# Patient Record
Sex: Female | Born: 1950 | Race: Black or African American | Hispanic: No | Marital: Married
Health system: Southern US, Community
[De-identification: ages and names within clinical notes are randomized; demographics above are authoritative.]

---

## 2006-11-11 ENCOUNTER — Ambulatory Visit: Payer: Self-pay | Admitting: Family Medicine

## 2007-12-14 ENCOUNTER — Ambulatory Visit: Payer: Self-pay | Admitting: Family Medicine

## 2008-01-19 ENCOUNTER — Other Ambulatory Visit: Payer: Self-pay

## 2008-01-20 ENCOUNTER — Inpatient Hospital Stay: Payer: Self-pay | Admitting: Internal Medicine

## 2008-01-20 ENCOUNTER — Ambulatory Visit: Payer: Self-pay | Admitting: Internal Medicine

## 2008-01-27 ENCOUNTER — Ambulatory Visit: Payer: Self-pay | Admitting: Internal Medicine

## 2008-02-06 ENCOUNTER — Ambulatory Visit: Payer: Self-pay | Admitting: Family

## 2008-02-07 ENCOUNTER — Ambulatory Visit: Payer: Self-pay | Admitting: Gastroenterology

## 2008-02-20 ENCOUNTER — Ambulatory Visit: Payer: Self-pay | Admitting: Internal Medicine

## 2008-02-21 ENCOUNTER — Ambulatory Visit: Payer: Self-pay | Admitting: Gastroenterology

## 2008-03-07 ENCOUNTER — Ambulatory Visit: Payer: Self-pay | Admitting: Gastroenterology

## 2008-03-08 ENCOUNTER — Ambulatory Visit: Payer: Self-pay | Admitting: Gastroenterology

## 2008-04-02 ENCOUNTER — Ambulatory Visit: Payer: Self-pay | Admitting: Family

## 2008-04-30 ENCOUNTER — Ambulatory Visit: Payer: Self-pay | Admitting: Family

## 2008-06-14 ENCOUNTER — Ambulatory Visit: Payer: Self-pay | Admitting: Family

## 2008-07-17 ENCOUNTER — Ambulatory Visit: Payer: Self-pay | Admitting: Gastroenterology

## 2008-08-29 ENCOUNTER — Ambulatory Visit: Payer: Self-pay

## 2008-09-06 ENCOUNTER — Ambulatory Visit: Payer: Self-pay | Admitting: Family

## 2008-12-05 ENCOUNTER — Ambulatory Visit: Payer: Self-pay | Admitting: Family

## 2009-01-29 ENCOUNTER — Ambulatory Visit: Payer: Self-pay | Admitting: Family Medicine

## 2009-04-25 ENCOUNTER — Ambulatory Visit: Payer: Self-pay | Admitting: Family

## 2009-09-26 ENCOUNTER — Ambulatory Visit: Payer: Self-pay | Admitting: Family

## 2009-10-18 ENCOUNTER — Emergency Department: Payer: Self-pay | Admitting: Emergency Medicine

## 2009-10-18 ENCOUNTER — Ambulatory Visit: Payer: Self-pay | Admitting: Family Medicine

## 2009-10-21 ENCOUNTER — Emergency Department: Payer: Self-pay | Admitting: Emergency Medicine

## 2010-01-17 ENCOUNTER — Ambulatory Visit: Payer: Self-pay | Admitting: Family

## 2010-04-03 ENCOUNTER — Ambulatory Visit: Payer: Self-pay | Admitting: Family

## 2010-04-15 ENCOUNTER — Ambulatory Visit: Payer: Self-pay | Admitting: Family Medicine

## 2010-08-08 ENCOUNTER — Ambulatory Visit: Payer: Self-pay | Admitting: Family

## 2011-01-30 ENCOUNTER — Ambulatory Visit: Payer: Self-pay

## 2011-05-21 ENCOUNTER — Other Ambulatory Visit: Payer: Self-pay | Admitting: Family Medicine

## 2011-10-23 ENCOUNTER — Ambulatory Visit: Payer: Self-pay | Admitting: Internal Medicine

## 2011-11-01 LAB — COMPREHENSIVE METABOLIC PANEL
Albumin: 2.1 g/dL — ABNORMAL LOW (ref 3.4–5.0)
Anion Gap: 13 (ref 7–16)
BUN: 28 mg/dL — ABNORMAL HIGH (ref 7–18)
Calcium, Total: 7.4 mg/dL — ABNORMAL LOW (ref 8.5–10.1)
Chloride: 108 mmol/L — ABNORMAL HIGH (ref 98–107)
Co2: 21 mmol/L (ref 21–32)
Creatinine: 1.85 mg/dL — ABNORMAL HIGH (ref 0.60–1.30)
EGFR (African American): 36 — ABNORMAL LOW
EGFR (Non-African Amer.): 30 — ABNORMAL LOW
Glucose: 160 mg/dL — ABNORMAL HIGH (ref 65–99)
Osmolality: 292 (ref 275–301)
Potassium: 3.8 mmol/L (ref 3.5–5.1)
SGOT(AST): 149 U/L — ABNORMAL HIGH (ref 15–37)
SGPT (ALT): 37 U/L
Sodium: 142 mmol/L (ref 136–145)

## 2011-11-01 LAB — URINALYSIS, COMPLETE
Glucose,UR: 150 mg/dL (ref 0–75)
Ketone: NEGATIVE
Ph: 5 (ref 4.5–8.0)
Protein: NEGATIVE
RBC,UR: <1 /HPF (ref 0–5)
Specific Gravity: 1.013 (ref 1.003–1.030)
Squamous Epithelial: 1
White Blood Cell Cast: 1

## 2011-11-01 LAB — CBC
HCT: 31.1 % — ABNORMAL LOW (ref 35.0–47.0)
HGB: 10.3 g/dL — ABNORMAL LOW (ref 12.0–16.0)
MCH: 32 pg (ref 26.0–34.0)
MCHC: 33.1 g/dL (ref 32.0–36.0)
MCV: 97 fL (ref 80–100)
Platelet: 91 10*3/uL — ABNORMAL LOW (ref 150–440)
RDW: 21.8 % — ABNORMAL HIGH (ref 11.5–14.5)
WBC: 3.5 10*3/uL — ABNORMAL LOW (ref 3.6–11.0)

## 2011-11-01 LAB — PROTIME-INR: INR: 1.7

## 2011-11-02 ENCOUNTER — Inpatient Hospital Stay: Payer: Self-pay | Admitting: Internal Medicine

## 2011-11-03 LAB — COMPREHENSIVE METABOLIC PANEL
Albumin: 1.9 g/dL — ABNORMAL LOW (ref 3.4–5.0)
Alkaline Phosphatase: 236 U/L — ABNORMAL HIGH (ref 50–136)
BUN: 33 mg/dL — ABNORMAL HIGH (ref 7–18)
Bilirubin,Total: 17.7 mg/dL — ABNORMAL HIGH (ref 0.2–1.0)
Calcium, Total: 7.1 mg/dL — ABNORMAL LOW (ref 8.5–10.1)
Chloride: 109 mmol/L — ABNORMAL HIGH (ref 98–107)
Creatinine: 2.71 mg/dL — ABNORMAL HIGH (ref 0.60–1.30)
EGFR (African American): 23 — ABNORMAL LOW
EGFR (Non-African Amer.): 19 — ABNORMAL LOW
Glucose: 123 mg/dL — ABNORMAL HIGH (ref 65–99)
Potassium: 3.5 mmol/L (ref 3.5–5.1)
SGOT(AST): 135 U/L — ABNORMAL HIGH (ref 15–37)
SGPT (ALT): 38 U/L
Total Protein: 5.6 g/dL — ABNORMAL LOW (ref 6.4–8.2)

## 2011-11-03 LAB — CBC WITH DIFFERENTIAL/PLATELET
Bands: 7 %
HCT: 29.4 % — ABNORMAL LOW (ref 35.0–47.0)
HGB: 9.6 g/dL — ABNORMAL LOW (ref 12.0–16.0)
Lymphocytes: 20 %
MCHC: 32.7 g/dL (ref 32.0–36.0)
MCV: 96 fL (ref 80–100)
Platelet: 73 10*3/uL — ABNORMAL LOW (ref 150–440)
RDW: 21.2 % — ABNORMAL HIGH (ref 11.5–14.5)
Segmented Neutrophils: 53 %

## 2011-11-03 LAB — URINE CULTURE

## 2011-11-20 ENCOUNTER — Ambulatory Visit: Payer: Self-pay | Admitting: Internal Medicine

## 2011-12-21 DEATH — deceased

## 2012-08-26 IMAGING — CR DG CHEST 2V
1 series · 2 of 2 positions shown · non-contrast
Comparison: none

REASON FOR EXAM: fever, altered mental status, lever failure, recent
pneumonia
COMMENTS:

[Series 1: ap · 0.17mm/px · 2 of 2 slices shown]
[im 1/2]
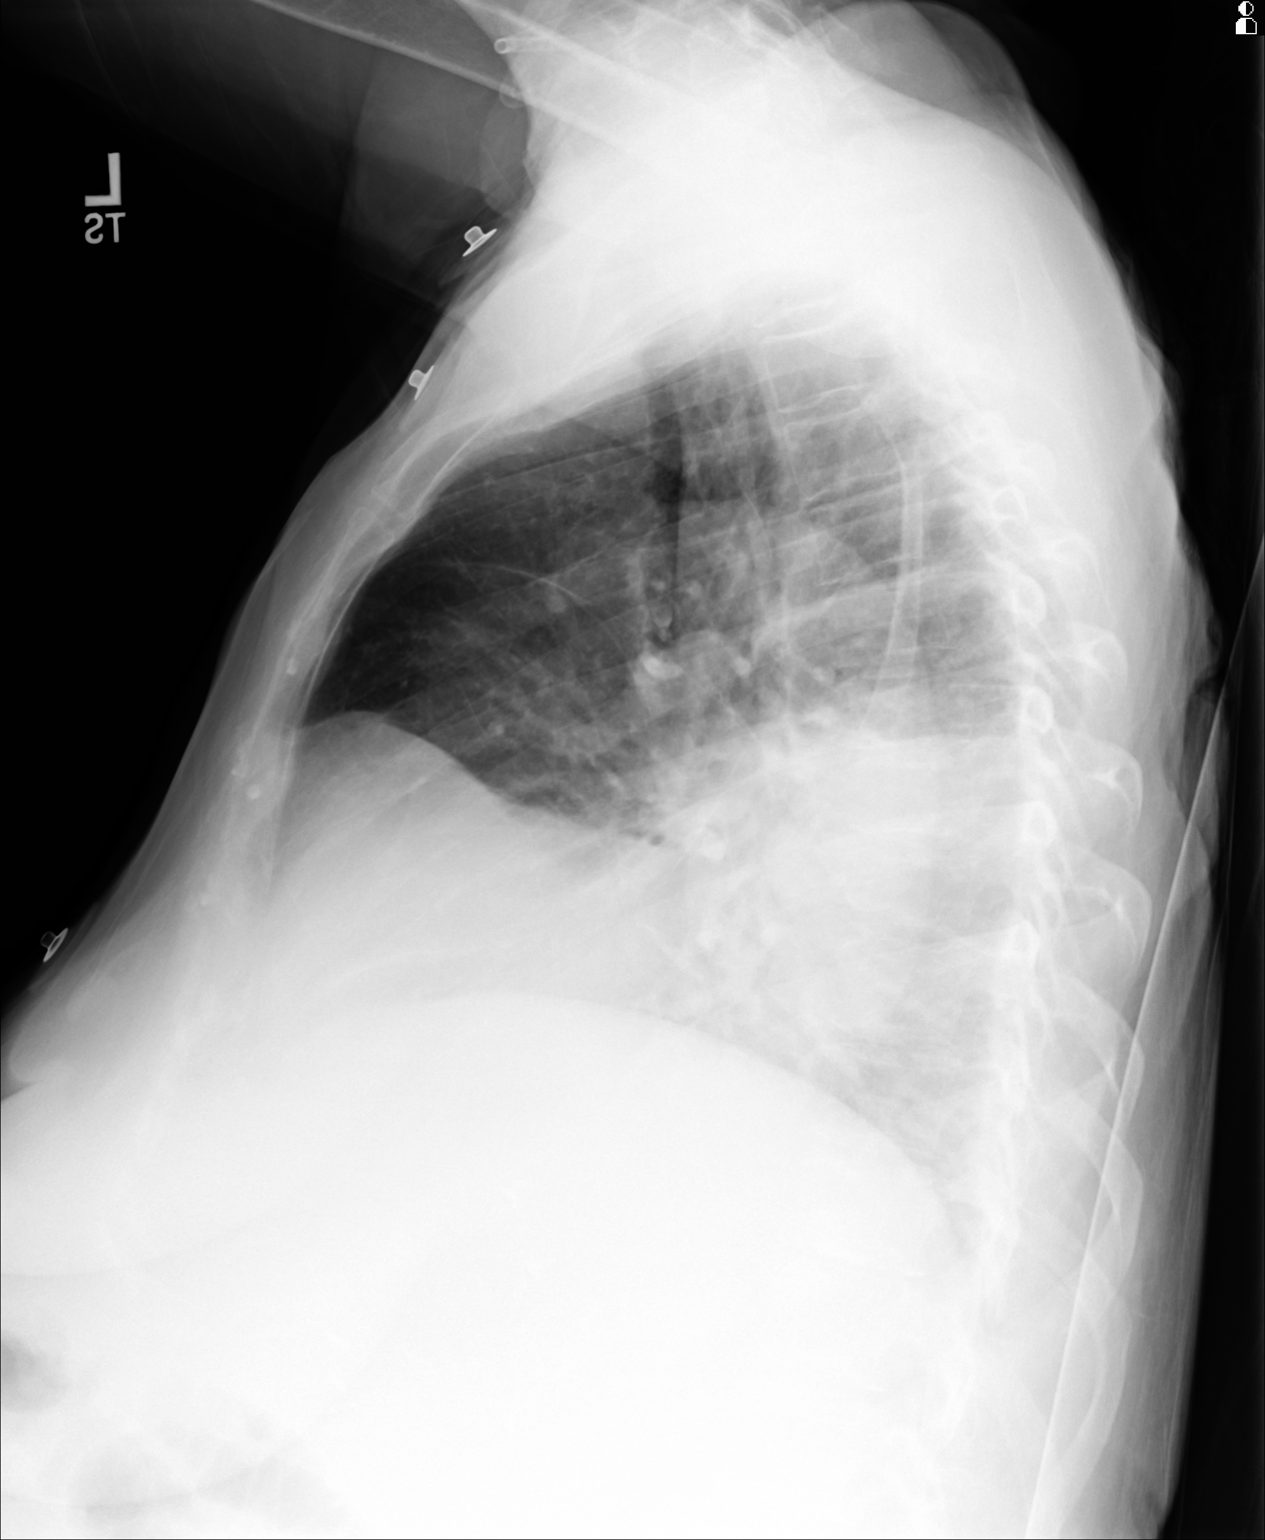
[im 2/2]
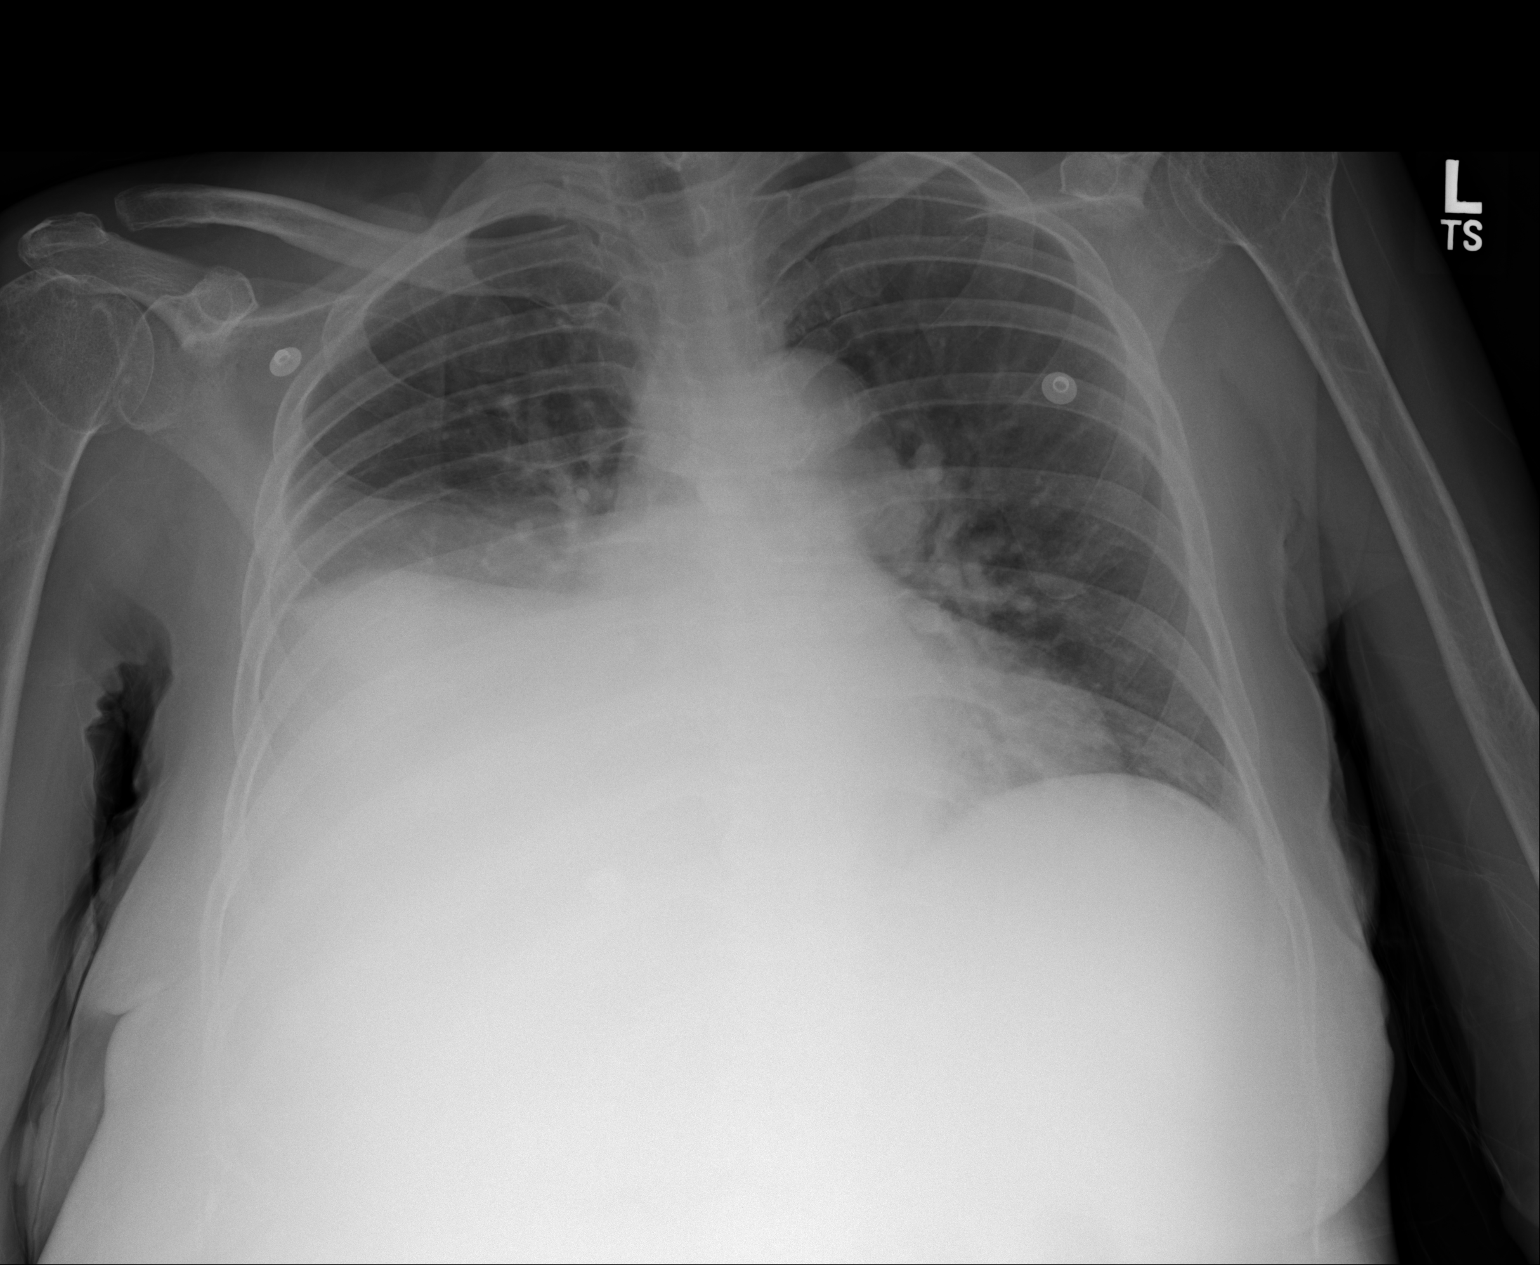

[2 of 2 positions shown; findings below may reference images not displayed]

PROCEDURE:     DXR - DXR CHEST PA (OR AP) AND LATERAL  - November 01, 2011 [DATE]

RESULT:     Comparison is made to a prior study dated 10/21/2009.

There is marked elevation of the right hemidiaphragm. An area of increased
density projects in the right lung base a component of which is likely
secondary to vascular crowding. An underlying small right pleural effusion
and/or atelectasis cannot be excluded. The cardiac silhouette is partially
obscured and otherwise appears grossly unremarkable. The visualized bony
skeleton is unremarkable.
IMPRESSION: Atelectasis versus infiltrate right lower lobe as well as
marked elevation of the right hemidiaphragm.

## 2012-08-27 IMAGING — CT CT HEAD WITHOUT CONTRAST
2 series · 16 of 30 positions shown, 20 images · non-contrast
Comparison: none

REASON FOR EXAM: AMS.  Aphasia
COMMENTS:

[Series 2: without · axial · non-contrast · 0.42mm/px · z∈[+352,+472]mm · 13 of 30 slices shown, 17 images]
[im 3/30  brain]
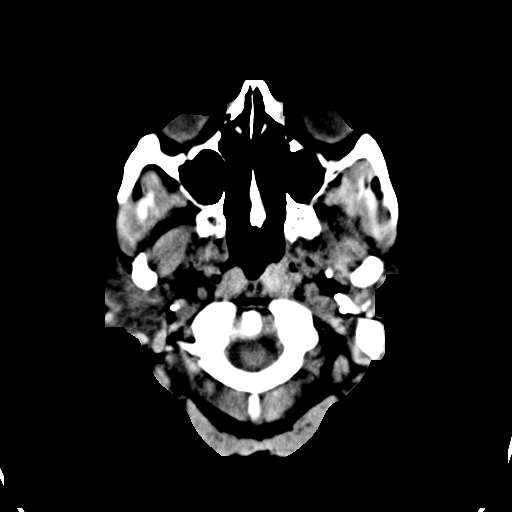
[im 3/30  bone]
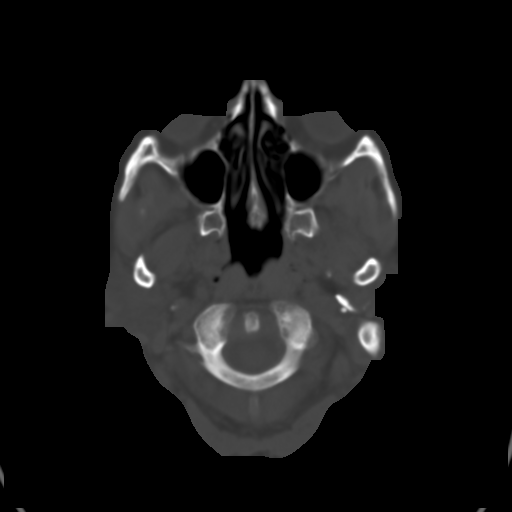
[im 5/30  brain]
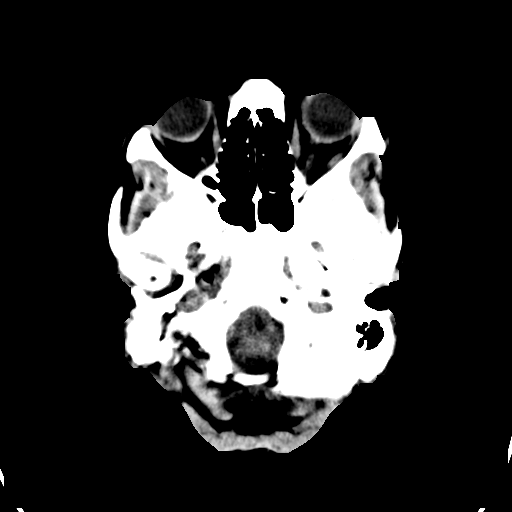
[im 7/30  brain]
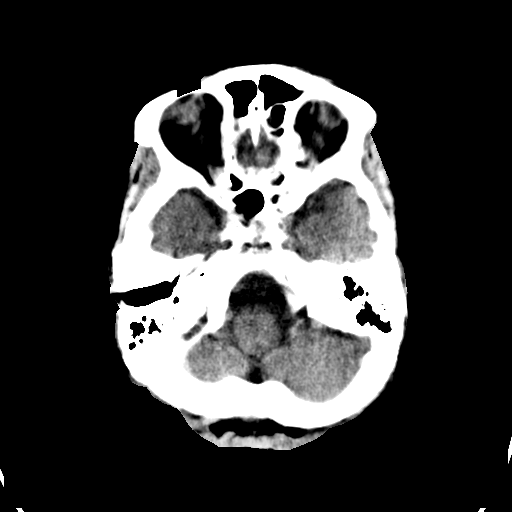
[im 9/30  brain]
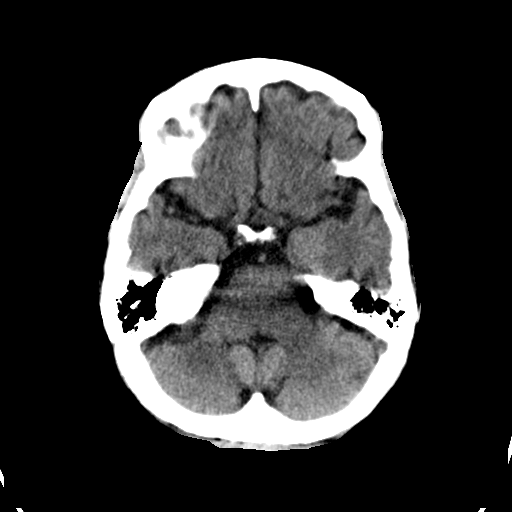
[im 11/30  brain]
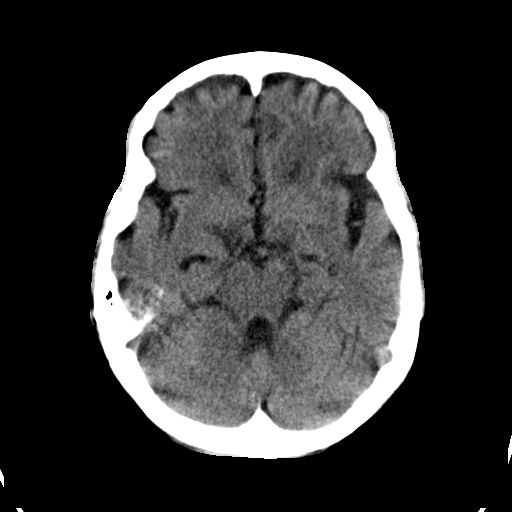
[im 11/30  bone]
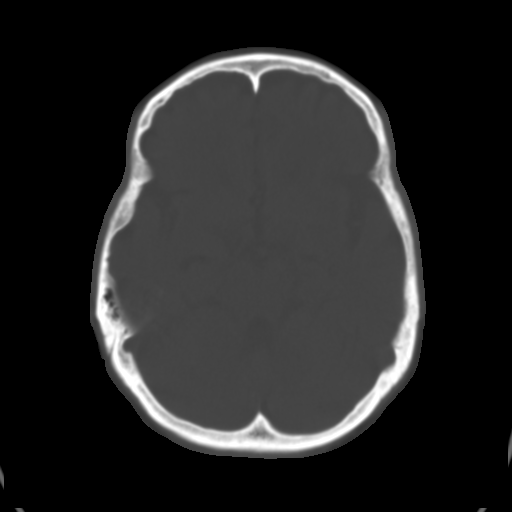
[im 13/30  brain]
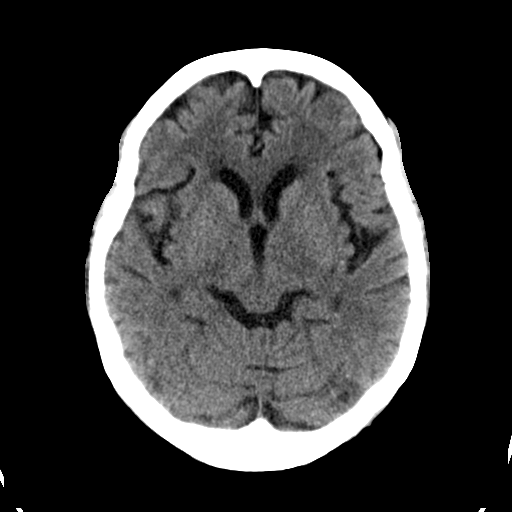
[im 15/30  brain]
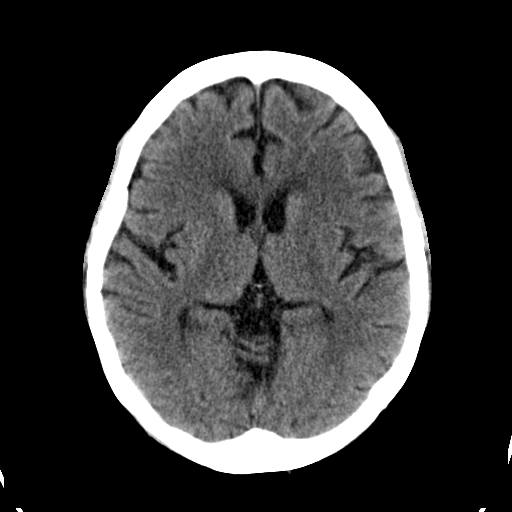
[im 17/30  brain]
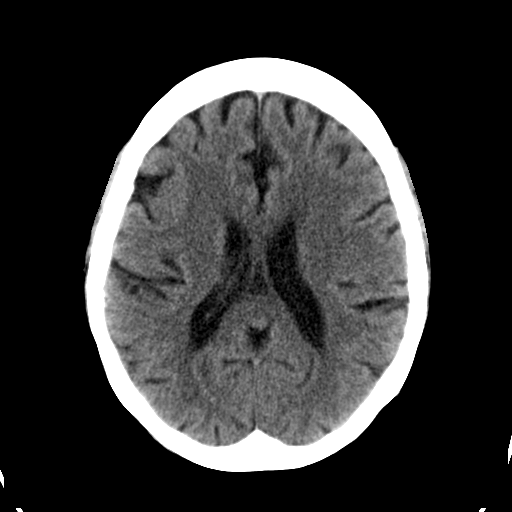
[im 19/30  brain]
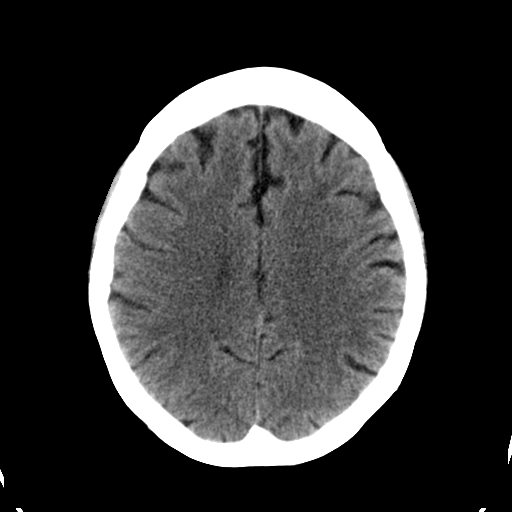
[im 19/30  bone]
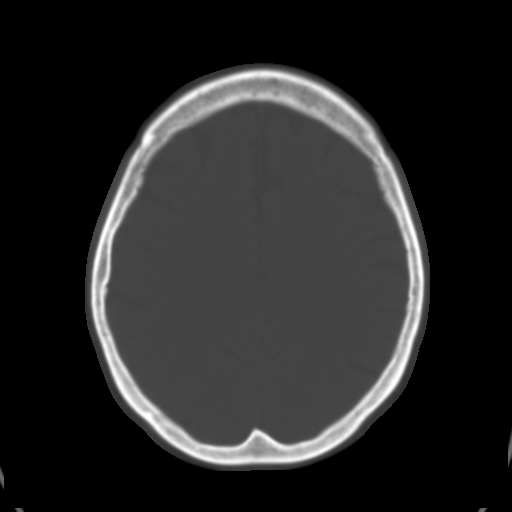
[im 21/30  brain]
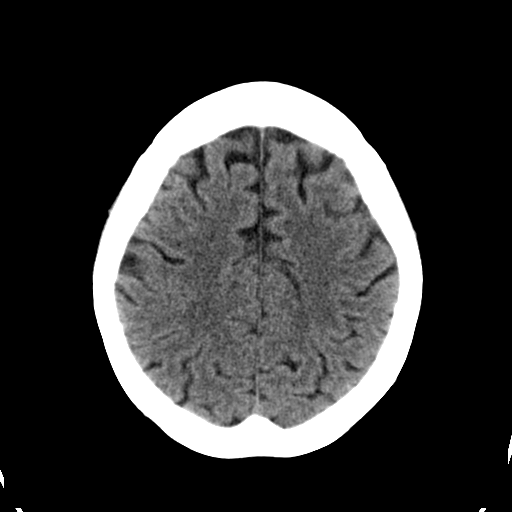
[im 23/30  brain]
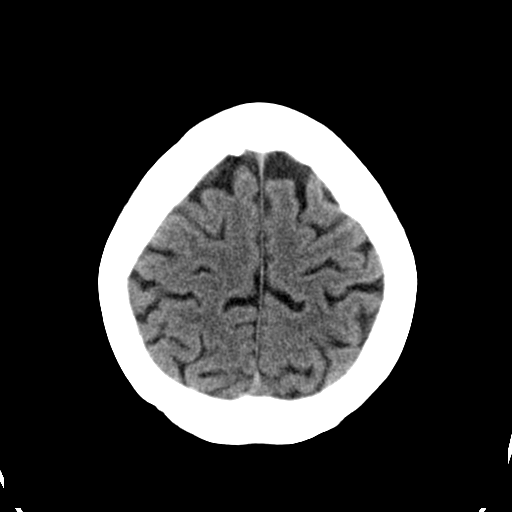
[im 25/30  brain]
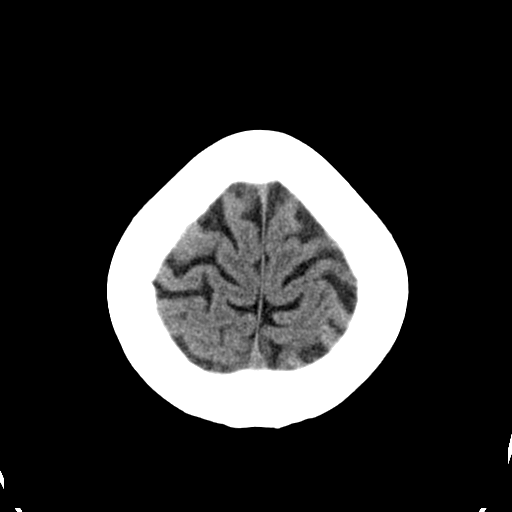
[im 27/30  brain]
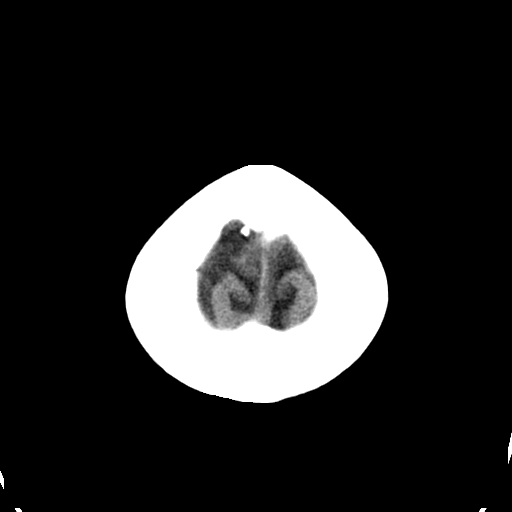
[im 27/30  bone]
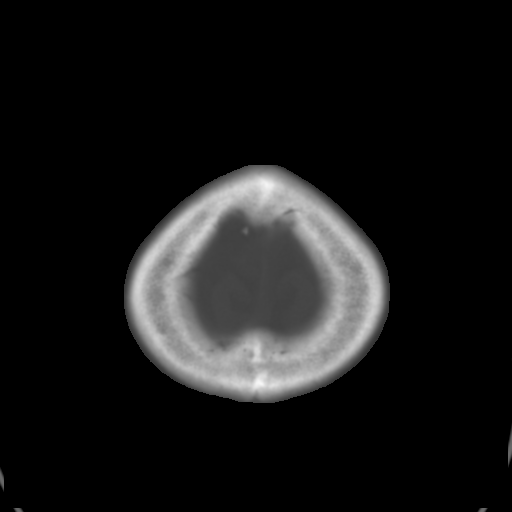

[Series 3: bone · axial · 0.42mm/px · z∈[+352,+392]mm · 3 of 30 slices shown]
[im 3/30  bone]
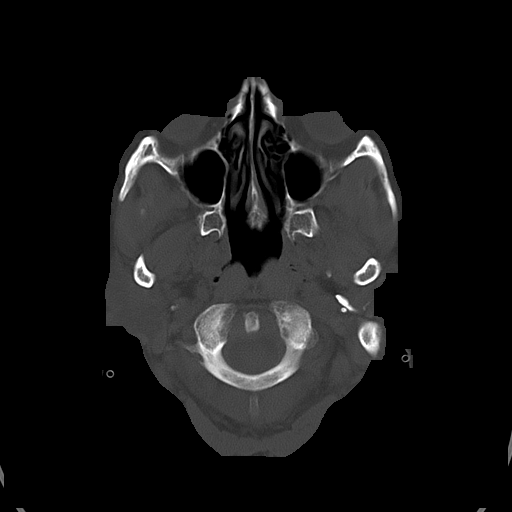
[im 7/30  bone]
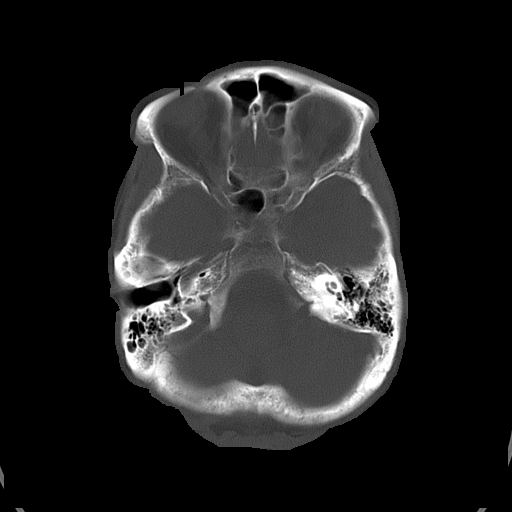
[im 11/30  bone]
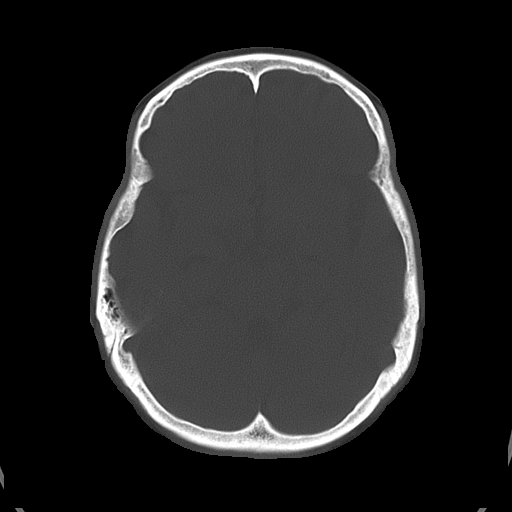

[16 of 30 positions shown; findings below may reference images not displayed]

PROCEDURE:     CT  - CT HEAD WITHOUT CONTRAST  - November 02, 2011  [DATE]

RESULT:     Emergent noncontrast CT of the brain is performed in the
standard fashion. The patient has no previous exam for comparison.

There is mild prominence of the ventricles and sulci consistent with
atrophy. Mild low-attenuation in the periventricular and subcortical white
matter regions is most likely secondary to chronic small vessel ischemic
disease. There is no territorial infarct, intracranial hemorrhage, mass
effect or midline shift. The visualized paranasal sinuses and mastoid air
cells show normal aeration. The calvarium is intact.
IMPRESSION: 1.     Chronic small vessel ischemic disease with mild atrophy. No acute
intracranial abnormality evident.

## 2015-01-13 NOTE — Discharge Summary (Signed)
PATIENT NAME:  Zoe Thompson, [name] MR#:  [id number] DATE OF BIRTH:  [birth year]  DATE OF ADMISSION:  11/02/2011 DATE OF DISCHARGE:  11/03/2011  DIAGNOSES:  1. Fever, unclear etiology, resolved. No evidence of any acute infection.  2. Borderline low blood pressure. 3. Altered mental status, resolved. No evidence of cerebrovascular accident. 4. History of hepatic encephalopathy. 5. Acute renal failure. 6. History of hepatitis C with liver cirrhosis, failed liver transplant. 7. Pancytopenia. 8. Coagulopathy.  9. Hyperbilirubinemia.  10. Transaminitis.  11. Hypoalbuminemia. 12. Chronic abdominal pain.   DISPOSITION: Patient is being discharged home with hospice.   CONSULTATION: Palliative care consultation with Dr. Harvie JuniorPhifer.   DISCHARGE MEDICATIONS:  1. Amitriptyline 10 mg daily.  2. Xifaxan 550 mg b.i.d.  3. Lactulose 10 grams/15 mL 3 to 4 doses p.r.n.  4. Multivitamin 1 tablet daily. 5. Combivent 2 puffs q.4 hours p.r.n.  6. K-Dur 20 mEq daily.  7. Nitroglycerin 0.4 mg sublingual q.5 minutes x3 doses. 8. Omeprazole 20 mg daily.  9. Humalog 10 units subcutaneously once a day as needed.  10. Tramadol 50 mg daily.  11. Patient has been advised to start Lasix from 10/05/11, Lasix 40 mg daily.  DIET: Low sodium, 1800 calorie ADA diet.  DISCHARGE INSTRUCTIONS: Resume with hospice. Follow up with primary care physician, Dr. Mila Merryonald Fisher, in 1 to 2 weeks after discharge.   ACTIVITY: As tolerated.   LABORATORY, DIAGNOSTIC, AND RADIOLOGICAL DATA: CAT scan of the head showed no acute abnormalities. Chest x-ray showed atelectasis versus infiltrate right lower lobe. Blood and urine cultures have shown no growth so far. Patient has chronic pancytopenia with white count 3.5, hemoglobin 10.3, platelet count 91273. Chronic transaminitis with hypoalbuminemia and hyperbilirubinemia; bilirubin ranging from 19.1 to 17.7. Alkaline phosphatase 299, AST 149, plasma ammonia 62, albumin 2.1. Creatinine  ranging from 1.85 to 2.71.   HOSPITAL COURSE: Patient is a 64 year old female with past medical history of hepatitis C with liver failure. Patient had undergone a liver transplant which has also failed. Patient had a prolonged [facility] following which she was discharged home with hospice. The patient's family brought her in because of weakness and altered mental status. They were concerned about a stroke. During the hospitalization patient was at her baseline. Her altered mental status had resolved. She had generalized weakness but there was no focal abnormality. Her CAT scan of the head was negative. She also had low grade fever initially, however, the source was unclear. During the hospitalization patient remained afebrile. Her blood and urine cultures were negative. Chest x-ray showed right base atelectasis versus infiltrate. This was most likely atelectasis given patient's ascites. She had normal white count. Patient's ammonia was 62 and she has been advised to continue her lactulose. She had some renal insufficiency possibly due to failure to thrive, poor oral intake, dehydration and diuretic use. Patient was given some gentle IV fluid hydration. She has been asked to resume her Lasix two days after discharge. Patient and her family are aware of guarded prognosis because of her end-stage liver disease. Patient is being discharged home in a stable condition with hospice.  TIME SPENT: 45 minutes.  ____________________________ Darrick MeigsSangeeta Amonie Wisser, MD sp:cms D: 11/03/2011 16:20:24 ET T: 11/03/2011 16:29:25 ET JOB#: [id number] cc: Demetrios Isaacsonald E. Sherrie MustacheFisher, MD Darrick MeigsSANGEETA Travis Purk MD ELECTRONICALLY SIGNED 11/04/2011 8:34

## 2015-01-13 NOTE — H&P (Signed)
PATIENT NAME:  Zoe Thompson, Zoe Thompson MR#:  [id number] DATE OF BIRTH:  04/08/1951  DATE OF ADMISSION:  11/02/2011  PRIMARY CARE PHYSICIAN: Dr. Mila Merryonald Fisher.   CHIEF COMPLAINT: Altered mental status and confusion and also fever.   HISTORY OF PRESENT ILLNESS: This is a 64 year old female who comes in from home brought in by EMS due to altered mental status and episode of vomiting. The patient was [facility] discharged [facility]. The patient was in the hospital there due to hepatic encephalopathy and suspected urinary tract infection. The patient has a history of chronic liver disease and liver cirrhosis from hepatitis C and failed liver transplant in 2011. She was made home hospice care and discharged about a week to 10 days ago. This morning, apparently, the patient had an episode of vomiting and then she was nonverbal for a little while and did not seem like herself. The daughter did not think this was related to encephalopathy and, therefore, she called EMS. The patient does have a history of diabetes, but she was not noted to be hypoglycemic. She was brought to the ER for further evaluation and noted to have a fever of as high as 103. The patient had been complaining of some abdominal pain. The patient has had no other further associated symptoms.   REVIEW OF SYSTEMS: CONSTITUTIONAL: Positive documented fever. No weight gain or weight loss. EYES: No blurred or double vision. ENT: No tinnitus or postnasal drip. No redness of the oropharynx. RESPIRATORY: No cough, no wheeze, no hemoptysis, no dyspnea. CARDIOVASCULAR: No chest pain, no orthopnea, no palpitations, no syncope. GASTROINTESTINAL: Positive nausea. Positive vomiting. No diarrhea. Positive generalized abdominal pain. No melena or hematochezia. GU: No dysuria or hematuria. ENDOCRINE: No polyuria or nocturia. No heat or cold intolerance. HEME: No anemia. No bruising. No bleeding. INTEGUMENTARY: No rashes. No lesions. MUSCULOSKELETAL: No arthritis,  no swelling, and no gout. NEUROLOGIC: No numbness, no tingling, no ataxia, no seizure-type activity. PSYCH: No anxiety, no insomnia, no ADD.   PAST MEDICAL HISTORY:  1. Hypertension.  2. Diabetes. 3. History of hepatitis C.  4. History of liver cirrhosis. 5. 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   ALLERGIES: No known drug allergies.   SOCIAL HISTORY: No smoking. No alcohol abuse. No illicit drug abuse. Lives at home with her daughter.   FAMILY HISTORY: Both mother and father died from complications of heart disease.   CURRENT MEDICATIONS:  1. Lactulose 30 milliliters every 12 to 8 hours.  2. Oxycodone 5 mg 1 to 2 tablets every 8-12 hours. 3. Zofran 4 mg q.4-6h. as needed.  4. Ativan 0.5 mg every six hours as needed. 5. Scopolamine transdermal patch every 72 hours. 6. Lasix 80 mg b.i.d.   PHYSICAL EXAMINATION:    VITAL SIGNS: Temperature 100.3, pulse 112, respirations 20, blood pressure 116/64, stats 98% on 2 liters nasal cannula.   GENERAL: She is a confused female lying in bed, but in no apparent distress.   HEENT: Atraumatic, normocephalic. Her extraocular muscles are intact. Her pupils are equal and reactive to light. Sclera is icteric. There is no conjunctival injection. No oropharyngeal erythema.   NECK: Supple. No jugular venous distention, no bruits, no lymphadenopathy, no thyromegaly.   HEART: Regular rate and rhythm. No murmurs, rubs, or clicks.   LUNGS: Clear to auscultation bilaterally. No rales, no rhonchi, no wheezes.   ABDOMEN: Soft, distended, positive fluid wave. Does have ascites. No hepatosplenomegaly appreciated. She has diffuse abdominal pain. No rebound. No rigidity.  EXTREMITIES: No evidence of any cyanosis, clubbing, or peripheral edema. Has +2 pedal and radial pulses bilaterally.   NEUROLOGIC: The patient is alert, awake, and oriented x1. Moves all extremities spontaneously. No focal motor or sensory deficits appreciated.   SKIN: Moist  and warm with no rashes.   LYMPHATIC: There is no cervical or axillary lymphadenopathy.   LABORATORY, DIAGNOSTIC, AND RADIOLOGICAL DATA: Glucose 160, BUN 28, creatinine 1.8, sodium 142, potassium 3.8, chloride 108, bicarbonate 21, ammonia 62. LFTs showed a total protein of 6.2, albumin 2.1, total bilirubin 19.1, alkaline phosphatase 299, AST 149, ALT 37. Troponin less than 0.02. White cell count 3.5, hemoglobin 10.3, hematocrit 31.1, platelet count 91,000 prothrombin time 19.9 and INR 1.7. Urinalysis is essentially within normal limits. The patient did have a chest x-ray done which also showed elevated right hemidiaphragm, but no evidence of any acute cardiopulmonary disease.   ASSESSMENT AND PLAN: This is a 64 year old female with past medical history of hypertension, history of liver cirrhosis due to hepatitis C status post liver transplant with rejection in October 2011, diabetes, failure to thrive, who presents to the hospital due to altered mental status, vomiting, and fever.  1. Fever. The exact source of the fever is currently unclear. The patient's chest x-ray is negative. She has no upper respiratory symptoms. Her urinalysis is also negative. She does have some generalized abdominal pain. I wonder if there is some evidence of peritonitis given her ascites. I will empirically start her on ceftriaxone. Follow her cultures. Would consider getting an ultrasound guided diagnostic and therapeutic paracentesis if needed and follow her clinically and follow her fever curve for now.  2. Altered mental status. Again, the etiology is unclear. I do not appreciate any hepatic encephalopathy. Her ammonia level is 62, baseline is around 40. Her urinalysis, as mentioned, is negative. I will go ahead and get a CT of her head to rule out a stroke although I think this is unlikely. Mental status is sort of back to baseline presently.  3. Acute renal failure. This is likely secondary to failure to thrive and  dehydration and poor p.o. intake. I will gently hydrate her with IV fluids and follow her BUN and creatinine and urine output. I will hold her Lasix for now.  4. History of hepatitis C, liver cirrhosis and having failed liver transplant. The patient now is on hospice home services. I attempted to discuss CODE STATUS with the patient's daughter, although they want the patient to still be a FULL CODE. I will get a palliative care consult to discuss goals of care with the family.  5. Anemia and thrombocytopenia. This is secondary to her chronic liver disease and liver cirrhosis. I will follow her counts. No evidence of any acute bleeding presently.  6. History of hepatic encephalopathy. Continue lactulose for now.   CODE STATUS: The patient is a FULL CODE.   TIME SPENT: 50 minutes.  ____________________________ Rolly Pancake. Cherlynn Kaiser, MD vjs:ap D: 11/02/2011 02:40:37 ET T: 11/02/2011 08:56:09 ET JOB#: [id number]  cc: Rolly Pancake. Cherlynn Kaiser, MD, <Dictator> Demetrios Isaacs. Sherrie Mustache, MD [place] Siren MD ELECTRONICALLY SIGNED 11/06/2011 10:36
